# Patient Record
Sex: Female | Born: 2005 | Race: Black or African American | Hispanic: Yes | Marital: Single | Smoking: Never smoker
Health system: Southern US, Community
[De-identification: ages and names within clinical notes are randomized; demographics above are authoritative.]

---
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
## 2023-01-05 ENCOUNTER — Ambulatory Visit: Payer: No Typology Code available for payment source | Admitting: Family

## 2023-01-12 ENCOUNTER — Ambulatory Visit (INDEPENDENT_AMBULATORY_CARE_PROVIDER_SITE_OTHER): Payer: No Typology Code available for payment source | Admitting: Family

## 2023-01-12 ENCOUNTER — Encounter: Payer: Self-pay | Admitting: Family

## 2023-01-12 VITALS — BP 107/71 | HR 90 | Ht 63.78 in | Wt 158.0 lb

## 2023-01-12 DIAGNOSIS — Z3042 Encounter for surveillance of injectable contraceptive: Secondary | ICD-10-CM | POA: Diagnosis not present

## 2023-01-12 DIAGNOSIS — N921 Excessive and frequent menstruation with irregular cycle: Secondary | ICD-10-CM

## 2023-01-12 DIAGNOSIS — Z113 Encounter for screening for infections with a predominantly sexual mode of transmission: Secondary | ICD-10-CM

## 2023-01-12 MED ORDER — MEDROXYPROGESTERONE ACETATE 150 MG/ML IM SUSP: 150.0000 mg | Freq: Once | INTRAMUSCULAR | Status: DC

## 2023-01-12 MED ORDER — MEDROXYPROGESTERONE ACETATE 150 MG/ML IM SUSP
150.0000 mg | Freq: Once | INTRAMUSCULAR | Status: AC
  Administered 2023-01-12: 150 mg via INTRAMUSCULAR

## 2023-01-12 MED ORDER — NORETHINDRONE ACETATE 5 MG PO TABS
5.0000 mg | ORAL_TABLET | Freq: Every day | ORAL | 2 refills | Status: DC
Start: 2023-01-12 — End: 2023-06-21

## 2023-01-12 NOTE — Progress Notes (Signed)
History was provided by the patient.  April Kelley is a 17 y.o. female who is here for breakthrough bleeding with depo.   PCP confirmed? Yes.    Pa, [place] from last visit: Assessment/Plan: 1. Dysmenorrhea 2. Encounter for Depo-Provera contraception -continue with depo -return 2 weeks before depo window starts   - medroxyPROGESTERone (DEPO-PROVERA) injection 150 mg   HPI:   -depo today  -needs refill for Aygestin, uses it when she has her period with depo or any spotting -usually has period or breakthrough bleeding after about one month of depo  -not really heavy at all; but cramping  -sexually active, no discharge changes no lesion, no pain with intercourse    Current Outpatient Medications on File Prior to Visit  Medication Sig Dispense Refill   norethindrone (AYGESTIN) 5 MG tablet Take 1 tablet (5 mg total) by mouth daily. 30 tablet 0   No current facility-administered medications on file prior to visit.    No Known Allergies  Physical Exam:    Vitals:   01/12/23 1342  BP: 107/71  Pulse: 90  Weight: 158 lb (71.7 kg)  Height: 5' 3.78" (1.62 m)    Blood pressure %iles are not available for patients who are 18 years or older. No LMP recorded.  Physical Exam Constitutional:      General: She is not in acute distress.    Appearance: She is well-developed.  HENT:     Head: Normocephalic and atraumatic.  Eyes:     General: No scleral icterus.    Pupils: Pupils are equal, round, and reactive to light.  Neck:     Thyroid: No thyromegaly.  Cardiovascular:     Rate and Rhythm: Normal rate and regular rhythm.     Heart sounds: Normal heart sounds. No murmur heard. Pulmonary:     Effort: Pulmonary effort is normal.     Breath sounds: Normal breath sounds.  Abdominal:     Palpations: Abdomen is soft.  Genitourinary:    Comments: Deferred  Musculoskeletal:        General: Normal range of motion.     Cervical back: Normal  range of motion and neck supple.  Lymphadenopathy:     Cervical: No cervical adenopathy.  Skin:    General: Skin is warm and dry.     Findings: No rash.  Neurological:     Mental Status: She is alert. She is disoriented.     Cranial Nerves: No cranial nerve deficit.  Psychiatric:        Behavior: Behavior normal.        Thought Content: Thought content normal.        Judgment: Judgment normal.      Assessment/Plan:  Breakthrough bleeding with depo, typically seen about one month after each injection is well-managed with Aygestin 5 mg as needed for bleeding. Will repeat gc/c. Return two weeks earlier than Depo window start or if new or worsening symptoms. Advised to track bleeding, consider not taking Aygestin as she may also be experiencing withdrawal bleeding 2/2 stopping norethindrone each time.   1. Breakthrough bleeding on depo provera 2. Encounter for Depo-Provera contraception - medroxyPROGESTERone (DEPO-PROVERA) injection 150 mg  3. Routine screening for STI (sexually transmitted infection) - C. trachomatis/N. gonorrhoeae RNA

## 2023-01-13 LAB — C. TRACHOMATIS/N. GONORRHOEAE RNA
C. trachomatis RNA, TMA: NOT DETECTED
N. gonorrhoeae RNA, TMA: NOT DETECTED

## 2023-03-15 ENCOUNTER — Encounter: Payer: No Typology Code available for payment source | Admitting: Family

## 2023-03-16 ENCOUNTER — Encounter: Payer: Self-pay | Admitting: Family

## 2023-03-16 ENCOUNTER — Other Ambulatory Visit (HOSPITAL_COMMUNITY)
Admission: RE | Admit: 2023-03-16 | Discharge: 2023-03-16 | Disposition: A | Discharge status: Home / Self Care | Payer: No Typology Code available for payment source | Source: Ambulatory Visit | Attending: Family | Admitting: Family

## 2023-03-16 ENCOUNTER — Ambulatory Visit (INDEPENDENT_AMBULATORY_CARE_PROVIDER_SITE_OTHER): Payer: No Typology Code available for payment source | Admitting: Family

## 2023-03-16 VITALS — BP 118/71 | HR 111 | Ht 63.78 in | Wt 162.8 lb

## 2023-03-16 DIAGNOSIS — R82998 Other abnormal findings in urine: Secondary | ICD-10-CM

## 2023-03-16 DIAGNOSIS — N921 Excessive and frequent menstruation with irregular cycle: Secondary | ICD-10-CM

## 2023-03-16 DIAGNOSIS — E611 Iron deficiency: Secondary | ICD-10-CM

## 2023-03-16 DIAGNOSIS — Z3042 Encounter for surveillance of injectable contraceptive: Secondary | ICD-10-CM

## 2023-03-16 DIAGNOSIS — Z1389 Encounter for screening for other disorder: Secondary | ICD-10-CM

## 2023-03-16 DIAGNOSIS — Z113 Encounter for screening for infections with a predominantly sexual mode of transmission: Secondary | ICD-10-CM | POA: Insufficient documentation

## 2023-03-16 DIAGNOSIS — Z3202 Encounter for pregnancy test, result negative: Secondary | ICD-10-CM | POA: Diagnosis not present

## 2023-03-16 LAB — POCT URINALYSIS DIPSTICK
Bilirubin, UA: NEGATIVE
Glucose, UA: NEGATIVE
Ketones, UA: NEGATIVE
Nitrite, UA: NEGATIVE
Protein, UA: NEGATIVE
Spec Grav, UA: 1.025 (ref 1.010–1.025)
Urobilinogen, UA: 1 E.U./dL
pH, UA: 6 (ref 5.0–8.0)

## 2023-03-16 LAB — POCT URINE PREGNANCY: Preg Test, Ur: NEGATIVE

## 2023-03-16 LAB — POCT HEMOGLOBIN: Hemoglobin: 13.6 g/dL (ref 11–14.6)

## 2023-03-16 MED ORDER — MEDROXYPROGESTERONE ACETATE 150 MG/ML IM SUSP
150.0000 mg | Freq: Once | INTRAMUSCULAR | Status: AC
  Administered 2023-03-16: 150 mg via INTRAMUSCULAR

## 2023-03-16 NOTE — Progress Notes (Signed)
History was provided by the patient.  April Kelley is a 17 y.o. female who is here for breakthrough bleeding on depo provera.   PCP confirmed? Yes.    Pa, [facility]  HPI:   -got period 2-3 times between depo; was not heavy  -was mostly spotting and the Aygestin 5 mg worked  -no pain with urination, currently bleeding now  -wants to keep with depo  -some cramping lightly with bleeding    Current Outpatient Medications on File Prior to Visit  Medication Sig Dispense Refill   norethindrone (AYGESTIN) 5 MG tablet Take 1 tablet (5 mg total) by mouth daily. 30 tablet 2   No current facility-administered medications on file prior to visit.    No Known Allergies  Physical Exam:    Vitals:   03/16/23 1343  BP: 118/71  Pulse: (!) 111  Weight: 162 lb 12.8 oz (73.8 kg)  Height: 5' 3.78" (1.62 m)   Wt Readings from Last 3 Encounters:  03/16/23 162 lb 12.8 oz (73.8 kg) (90 %, Z= 1.28)*  01/12/23 158 lb (71.7 kg) (88 %, Z= 1.17)*  10/20/22 157 lb 12.8 oz (71.6 kg) (88 %, Z= 1.18)*   * Growth percentiles are based on CDC (Girls, 2-20 Years) data.     Blood pressure %iles are not available for patients who are 18 years or older. No LMP recorded.  Physical Exam Constitutional:      General: She is not in acute distress.    Appearance: She is well-developed.  HENT:     Head: Normocephalic and atraumatic.  Eyes:     General: No scleral icterus.    Pupils: Pupils are equal, round, and reactive to light.  Neck:     Thyroid: No thyromegaly.  Cardiovascular:     Rate and Rhythm: Normal rate and regular rhythm.     Heart sounds: Normal heart sounds. No murmur heard. Pulmonary:     Effort: Pulmonary effort is normal.     Breath sounds: Normal breath sounds.  Musculoskeletal:        General: Normal range of motion.     Cervical back: Normal range of motion and neck supple.  Lymphadenopathy:     Cervical: No cervical adenopathy.  Skin:    General:  Skin is warm and dry.     Findings: No rash.  Neurological:     Mental Status: She is alert and oriented to person, place, and time.     Cranial Nerves: No cranial nerve deficit.  Psychiatric:        Behavior: Behavior normal.        Thought Content: Thought content normal.        Judgment: Judgment normal.     Lab Results  Component Value Date   HGB 13.6 03/16/2023    Assessment/Plan:  -continue with Depo provera, reviewed Hgb WNL  -advised that we will culture urine due to foul smell and leukocytes -return in 10 weeks for depo 2 weeks earlier than window  -return precautions reviewed  1. Breakthrough bleeding on depo provera 2. Encounter for Depo-Provera contraception - medroxyPROGESTERone (DEPO-PROVERA) injection 150 mg 3. Leukocytes in urine - Urine Culture 4. Iron deficiency - POCT hemoglobin 5. Routine screening for STI (sexually transmitted infection) - Urine cytology ancillary only 6. Pregnancy examination or test, negative result - POCT urine pregnancy 7. Screening for genitourinary condition - POCT urinalysis dipstick

## 2023-03-17 LAB — URINE CYTOLOGY ANCILLARY ONLY
Bacterial Vaginitis-Urine: NEGATIVE
Candida Urine: NEGATIVE
Chlamydia: NEGATIVE
Comment: NEGATIVE
Comment: NEGATIVE
Comment: NORMAL
Neisseria Gonorrhea: NEGATIVE
Trichomonas: NEGATIVE

## 2023-03-17 LAB — URINE CULTURE
MICRO NUMBER:: 15038827
Result:: NO GROWTH
SPECIMEN QUALITY:: ADEQUATE

## 2023-05-25 ENCOUNTER — Ambulatory Visit (INDEPENDENT_AMBULATORY_CARE_PROVIDER_SITE_OTHER): Payer: No Typology Code available for payment source | Admitting: Pediatrics

## 2023-05-25 DIAGNOSIS — Z3042 Encounter for surveillance of injectable contraceptive: Secondary | ICD-10-CM | POA: Diagnosis not present

## 2023-05-25 MED ORDER — MEDROXYPROGESTERONE ACETATE 150 MG/ML IM SUSY: PREFILLED_SYRINGE | Freq: Once | INTRAMUSCULAR | Status: DC

## 2023-05-25 MED ORDER — MEDROXYPROGESTERONE ACETATE 150 MG/ML IM SUSP
150.0000 mg | INTRAMUSCULAR | Status: AC
  Administered 2023-05-25: 150 mg via INTRAMUSCULAR

## 2023-05-26 NOTE — Progress Notes (Signed)
Patient received depo-provera injection by nurse and was not seen by provider

## 2023-06-18 ENCOUNTER — Other Ambulatory Visit: Payer: Self-pay | Admitting: Family

## 2023-07-22 ENCOUNTER — Ambulatory Visit (HOSPITAL_COMMUNITY)
Admission: EM | Admit: 2023-07-22 | Discharge: 2023-07-22 | Disposition: A | Discharge status: Home / Self Care | Payer: No Typology Code available for payment source | Attending: Emergency Medicine | Admitting: Emergency Medicine

## 2023-07-22 ENCOUNTER — Encounter (HOSPITAL_COMMUNITY): Payer: Self-pay

## 2023-07-22 DIAGNOSIS — B349 Viral infection, unspecified: Secondary | ICD-10-CM

## 2023-07-22 DIAGNOSIS — Z1152 Encounter for screening for COVID-19: Secondary | ICD-10-CM | POA: Diagnosis not present

## 2023-07-22 DIAGNOSIS — R509 Fever, unspecified: Secondary | ICD-10-CM | POA: Diagnosis present

## 2023-07-22 DIAGNOSIS — M545 Low back pain, unspecified: Secondary | ICD-10-CM | POA: Insufficient documentation

## 2023-07-22 LAB — POCT URINALYSIS DIP (MANUAL ENTRY)
Bilirubin, UA: NEGATIVE
Glucose, UA: NEGATIVE mg/dL
Ketones, POC UA: NEGATIVE mg/dL
Nitrite, UA: NEGATIVE
Protein Ur, POC: NEGATIVE mg/dL
Spec Grav, UA: >=1.03 — AB (ref 1.010–1.025)
Urobilinogen, UA: 0.2 U/dL
pH, UA: 6 (ref 5.0–8.0)

## 2023-07-22 LAB — POCT INFLUENZA A/B
Influenza A, POC: NEGATIVE
Influenza B, POC: NEGATIVE

## 2023-07-22 LAB — POCT URINE PREGNANCY: Preg Test, Ur: NEGATIVE

## 2023-07-22 MED ORDER — DICYCLOMINE HCL 20 MG PO TABS: 20.0000 mg | ORAL_TABLET | Freq: Three times a day (TID) | ORAL | 0 refills | Status: DC

## 2023-07-22 NOTE — ED Provider Notes (Signed)
[facility]    CSN: [id number] Arrival date & time: 07/22/23  1246      History   Chief Complaint Chief Complaint  Patient presents with   Abdominal Pain    HPI April Kelley is a 17 y.o. female.   Patient presents with concerns of feeling unwell for the past couple days. She reports fatigue, chills, sweats, intermittent headache, body aches, some nasal congestion, occasional cough, abdominal cramping, nausea with one episode of vomiting, decreased appetite, and some diarrhea. She states the abdominal cramping is intermittent and usually in her lower abdomen. She states she has had a low grade fever around 99-100F. She has been taking Theraflu and ibuprofen with some mild improvement. She inquires about making sure she does have COVID or the flu. She denies sore throat, difficulty breathing, or any urinary symptoms such as dysuria.  The history is provided by the patient.  Abdominal Pain Associated symptoms: chills, cough, diarrhea, fatigue, fever, nausea and vomiting   Associated symptoms: no chest pain, no dysuria, no shortness of breath and no sore throat     History reviewed. No pertinent past medical history.  There are no problems to display for this patient.   History reviewed. No pertinent surgical history.  OB History   No obstetric history on file.      Home Medications    Prior to Admission medications   Medication Sig Start Date End Date Taking? Authorizing Provider  dicyclomine (BENTYL) 20 MG tablet Take 1 tablet (20 mg total) by mouth 4 (four) times daily -  before meals and at bedtime for 3 days. 07/22/23 07/25/23 Yes Xayvion Shirah L, PA  montelukast (SINGULAIR) 4 MG chewable tablet Chew 4 mg by mouth at bedtime. 06/15/22  Yes [provider]  norethindrone (AYGESTIN) 5 MG tablet TAKE 1 TABLET(5 MG) BY MOUTH DAILY 06/21/23   Georges Mouse, NP    Family History History reviewed. No pertinent family history.  Social History Social  History   Tobacco Use   Smoking status: Never    Passive exposure: Yes   Smokeless tobacco: Never  Vaping Use   Vaping status: Every Day  Substance Use Topics   Alcohol use: Yes    Comment: socially   Drug use: Yes    Types: Marijuana     Allergies   Patient has no known allergies.   Review of Systems Review of Systems  Constitutional:  Positive for appetite change, chills, diaphoresis, fatigue and fever.  HENT:  Positive for congestion. Negative for ear pain and sore throat.   Respiratory:  Positive for cough. Negative for shortness of breath and wheezing.   Cardiovascular:  Negative for chest pain.  Gastrointestinal:  Positive for abdominal pain, diarrhea, nausea and vomiting. Negative for blood in stool.  Genitourinary:  Negative for dysuria, frequency and urgency.  Musculoskeletal:  Positive for myalgias. Negative for back pain.  Skin:  Negative for rash.  Neurological:  Positive for headaches. Negative for dizziness.     Physical Exam Triage Vital Signs ED Triage Vitals  Encounter Vitals Group     BP 07/22/23 1415 115/78     Systolic BP Percentile --      Diastolic BP Percentile --      Pulse Rate 07/22/23 1415 97     Resp 07/22/23 1415 16     Temp 07/22/23 1415 99 F (37.2 C)     Temp Source 07/22/23 1415 Oral     SpO2 07/22/23 1415 95 %  Weight 07/22/23 1415 180 lb (81.6 kg)     Height 07/22/23 1415 5' 3.25" (1.607 m)     Head Circumference --      Peak Flow --      Pain Score 07/22/23 1414 8     Pain Loc --      Pain Education --      Exclude from Growth Chart --    No data found.  Updated Vital Signs BP 115/78 (BP Location: Left Arm)   Pulse 97   Temp 99 F (37.2 C) (Oral)   Resp 16   Ht 5' 3.25" (1.607 m)   Wt 180 lb (81.6 kg)   LMP  (LMP Unknown)   SpO2 95%   BMI 31.63 kg/m   Visual Acuity Right Eye Distance:   Left Eye Distance:   Bilateral Distance:    Right Eye Near:   Left Eye Near:    Bilateral Near:     Physical  Exam Vitals and nursing note reviewed.  HENT:     Head: Normocephalic.     Nose: Nose normal.     Mouth/Throat:     Mouth: Mucous membranes are moist.     Pharynx: Oropharynx is clear.  Eyes:     Conjunctiva/sclera: Conjunctivae normal.     Pupils: Pupils are equal, round, and reactive to light.  Cardiovascular:     Rate and Rhythm: Normal rate and regular rhythm.     Heart sounds: Normal heart sounds.  Pulmonary:     Effort: Pulmonary effort is normal.     Breath sounds: Normal breath sounds. No wheezing, rhonchi or rales.  Abdominal:     General: Bowel sounds are normal.     Palpations: Abdomen is soft.     Tenderness: There is no abdominal tenderness. There is no right CVA tenderness, left CVA tenderness, guarding or rebound.  Musculoskeletal:     Cervical back: Normal range of motion.  Lymphadenopathy:     Cervical: No cervical adenopathy.  Skin:    Findings: No rash.  Neurological:     Mental Status: She is alert.  Psychiatric:        Mood and Affect: Mood normal.      UC Treatments / Results  Labs (all labs ordered are listed, but only abnormal results are displayed) Labs Reviewed  POCT URINALYSIS DIP (MANUAL ENTRY) - Abnormal; Notable for the following components:      Result Value   Clarity, UA cloudy (*)    Spec Grav, UA >=1.030 (*)    Blood, UA large (*)    Leukocytes, UA Trace (*)    All other components within normal limits  SARS CORONAVIRUS 2 (TAT 6-24 HRS)  POCT URINE PREGNANCY  POCT INFLUENZA A/B    EKG   Radiology No results found.  Procedures Procedures (including critical care time)  Medications Ordered in UC Medications - No data to display  Initial Impression / Assessment and Plan / UC Course  I have reviewed the triage vital signs and the nursing notes.  Pertinent labs & imaging results that were available during my care of the patient were reviewed by me and considered in my medical decision making (see chart for details).      Flu negative, COVID pending. Trace leuks on urine but no UTI sx, low suspicion of UTI. S/s consistent with viral infection. No abdominal tenderness or high fever to indicate more severe abdominal infectious process at this time. Sx tx and reassurance. Discussed return  and ER precautions.   E/M: 1 acute uncomplicated illness, 4 data (UA, preg, flu, COVID), moderate risk due to prescription management   Final Clinical Impressions(s) / UC Diagnoses   Final diagnoses:  Viral infection     Discharge Instructions      Flu test negative. COVID test sent out - we will contact you with results.  Take Bentyl as prescribed as needed to help with abdominal cramping. Rest and keep hydrated. Can take OTC medicine as well for symptoms.  Follow-up with PCP if no improvement in a week. Go to the ER if develop severe abdominal pain, vomiting and unable to keep down fluids for more than 24 hours, or difficulty breathing.     ED Prescriptions     Medication Sig Dispense Auth. Provider   dicyclomine (BENTYL) 20 MG tablet Take 1 tablet (20 mg total) by mouth 4 (four) times daily -  before meals and at bedtime for 3 days. 12 tablet Vallery Sa, Rameen Gohlke L, PA      PDMP not reviewed this encounter.   Estanislado Pandy, [place] 07/22/23 1525

## 2023-07-22 NOTE — ED Triage Notes (Signed)
Patient here today with c/o lower abd cramping, nausea, loss of appetite, fever, chills, sweats, and body aches X 2 days. Temp at home was over 100. She has been taking Theraflu and IBU with some relief.

## 2023-07-22 NOTE — Discharge Instructions (Signed)
Flu test negative. COVID test sent out - we will contact you with results.  Take Bentyl as prescribed as needed to help with abdominal cramping. Rest and keep hydrated. Can take OTC medicine as well for symptoms.  Follow-up with PCP if no improvement in a week. Go to the ER if develop severe abdominal pain, vomiting and unable to keep down fluids for more than 24 hours, or difficulty breathing.

## 2023-07-23 LAB — SARS CORONAVIRUS 2 (TAT 6-24 HRS): SARS Coronavirus 2: NEGATIVE

## 2023-08-03 ENCOUNTER — Encounter: Payer: Self-pay | Admitting: Family

## 2023-08-03 ENCOUNTER — Ambulatory Visit: Payer: No Typology Code available for payment source | Admitting: Family

## 2023-08-03 VITALS — BP 109/68 | HR 101 | Ht 63.48 in | Wt 169.6 lb

## 2023-08-03 DIAGNOSIS — Z3042 Encounter for surveillance of injectable contraceptive: Secondary | ICD-10-CM

## 2023-08-03 MED ORDER — MEDROXYPROGESTERONE ACETATE 150 MG/ML IM SUSP
150.0000 mg | Freq: Once | INTRAMUSCULAR | Status: AC
  Administered 2023-08-03: 150 mg via INTRAMUSCULAR

## 2023-08-03 NOTE — Progress Notes (Signed)
Pt presents for depo injection. Pt within depo window, no urine hcg needed.  Injection given, tolerated well. F/u depo injection visit scheduled.    First depo injection: 01/16/2020 Due for bone density: normal bone density DXA on 03/23/2022, next due on 03/23/2024  Patient last 3 weights:  Wt Readings from Last 3 Encounters:  08/03/23 169 lb 9.6 oz (76.9 kg) (92%, Z= 1.41)*  07/22/23 180 lb (81.6 kg) (95%, Z= 1.62)*  03/16/23 162 lb 12.8 oz (73.8 kg) (90%, Z= 1.28)*   * Growth percentiles are based on CDC (Girls, 2-20 Years) data.     Last Blood Pressure:   BP Readings from Last 3 Encounters:  08/03/23 109/68  07/22/23 115/78  03/16/23 118/71

## 2023-10-21 ENCOUNTER — Ambulatory Visit (INDEPENDENT_AMBULATORY_CARE_PROVIDER_SITE_OTHER): Payer: No Typology Code available for payment source | Admitting: Family

## 2023-10-21 ENCOUNTER — Encounter: Payer: Self-pay | Admitting: Family

## 2023-10-21 VITALS — BP 107/64 | HR 89 | Ht 64.0 in | Wt 175.4 lb

## 2023-10-21 DIAGNOSIS — Z3042 Encounter for surveillance of injectable contraceptive: Secondary | ICD-10-CM

## 2023-10-21 MED ORDER — MEDROXYPROGESTERONE ACETATE 150 MG/ML IM SUSP
150.0000 mg | Freq: Once | INTRAMUSCULAR | Status: AC
  Administered 2023-10-21: 150 mg via INTRAMUSCULAR

## 2023-10-21 NOTE — Progress Notes (Signed)
 Pt presents for depo injection. Pt within depo window, no urine hcg needed.  Injection given, tolerated well. F/u depo injection visit scheduled.    First depo injection: 01/16/2020 Last DXA: 03/19/2022 (normal)  Due for bone density: 03/19/2024  Patient last 3 weights:  Wt Readings from Last 3 Encounters:  10/21/23 175 lb 6.4 oz (79.6 kg) (94%, Z= 1.52)*  08/03/23 169 lb 9.6 oz (76.9 kg) (92%, Z= 1.41)*  07/22/23 180 lb (81.6 kg) (95%, Z= 1.62)*   * Growth percentiles are based on CDC (Girls, 2-20 Years) data.     Last Blood Pressure:   BP Readings from Last 3 Encounters:  10/21/23 107/64  08/03/23 109/68  07/22/23 115/78

## 2023-10-29 ENCOUNTER — Other Ambulatory Visit: Payer: Self-pay | Admitting: Family

## 2023-11-25 ENCOUNTER — Other Ambulatory Visit: Payer: Self-pay | Admitting: Family

## 2023-11-25 ENCOUNTER — Encounter: Payer: Self-pay | Admitting: Family

## 2023-11-25 MED ORDER — NORETHINDRONE ACETATE 5 MG PO TABS: ORAL_TABLET | ORAL | 0 refills | Status: DC

## 2023-12-19 ENCOUNTER — Other Ambulatory Visit: Payer: Self-pay | Admitting: Family

## 2023-12-30 ENCOUNTER — Other Ambulatory Visit (HOSPITAL_COMMUNITY)
Admission: RE | Admit: 2023-12-30 | Discharge: 2023-12-30 | Disposition: A | Discharge status: Home / Self Care | Source: Ambulatory Visit | Attending: Family | Admitting: Family

## 2023-12-30 ENCOUNTER — Ambulatory Visit (INDEPENDENT_AMBULATORY_CARE_PROVIDER_SITE_OTHER): Payer: No Typology Code available for payment source | Admitting: Family

## 2023-12-30 ENCOUNTER — Encounter: Payer: Self-pay | Admitting: Family

## 2023-12-30 VITALS — BP 110/70 | HR 75 | Ht 63.48 in | Wt 173.4 lb

## 2023-12-30 DIAGNOSIS — N921 Excessive and frequent menstruation with irregular cycle: Secondary | ICD-10-CM | POA: Diagnosis not present

## 2023-12-30 DIAGNOSIS — Z113 Encounter for screening for infections with a predominantly sexual mode of transmission: Secondary | ICD-10-CM

## 2023-12-30 DIAGNOSIS — Z3042 Encounter for surveillance of injectable contraceptive: Secondary | ICD-10-CM | POA: Diagnosis not present

## 2023-12-30 MED ORDER — MEDROXYPROGESTERONE ACETATE 150 MG/ML IM SUSP
150.0000 mg | Freq: Once | INTRAMUSCULAR | Status: AC
  Administered 2023-12-30: 150 mg via INTRAMUSCULAR

## 2023-12-30 NOTE — Progress Notes (Signed)
 History was provided by the patient.  April Kelley is a 18 y.o. female who is here for Depo.   PCP confirmed? Yes.    Pa, [facility]  HPI:   Having some breakthrough bleeding with depo  Cramping when bleeding only  No significant change in pattern or pain  Considering coming off depo but wants to be respectful of mom; denies pregnancy intention  Wants injection today, plans to schedule follow-up in 10 weeks and will decide between now and then if she will continue with method.   Current Outpatient Medications on File Prior to Visit  Medication Sig Dispense Refill   norethindrone (AYGESTIN) 5 MG tablet TAKE 1 TABLET BY MOUTH AS NEEDED FOR BREAKTHROUGH BLEEDING 30 tablet 0   dicyclomine (BENTYL) 20 MG tablet Take 1 tablet (20 mg total) by mouth 4 (four) times daily -  before meals and at bedtime for 3 days. 12 tablet 0   montelukast (SINGULAIR) 4 MG chewable tablet Chew 4 mg by mouth at bedtime. (Patient not taking: Reported on 12/30/2023)     Current Facility-Administered Medications on File Prior to Visit  Medication Dose Route Frequency Provider Last Rate Last Admin   medroxyPROGESTERone (DEPO-PROVERA) injection 150 mg  150 mg Intramuscular Q90 days Kathi Simpers, MD   150 mg at 05/25/23 1522    No Known Allergies  Physical Exam:    Vitals:   12/30/23 1013  BP: 110/70  Pulse: 75  Weight: 173 lb 6.4 oz (78.7 kg)  Height: 5' 3.48" (1.612 m)   Wt Readings from Last 3 Encounters:  12/30/23 173 lb 6.4 oz (78.7 kg) (93%, Z= 1.46)*  10/21/23 175 lb 6.4 oz (79.6 kg) (94%, Z= 1.52)*  08/03/23 169 lb 9.6 oz (76.9 kg) (92%, Z= 1.41)*   * Growth percentiles are based on CDC (Girls, 2-20 Years) data.     Blood pressure %iles are not available for patients who are 18 years or older. No LMP recorded. Patient has had an injection.  Physical Exam Constitutional:      General: She is not in acute distress.    Appearance: She is well-developed.  HENT:      Head: Normocephalic and atraumatic.  Eyes:     General: No scleral icterus.    Pupils: Pupils are equal, round, and reactive to light.  Neck:     Thyroid: No thyromegaly.  Cardiovascular:     Rate and Rhythm: Normal rate and regular rhythm.     Heart sounds: Normal heart sounds. No murmur heard. Pulmonary:     Effort: Pulmonary effort is normal.     Breath sounds: Normal breath sounds.  Musculoskeletal:        General: Normal range of motion.     Cervical back: Normal range of motion and neck supple.  Lymphadenopathy:     Cervical: No cervical adenopathy.  Skin:    General: Skin is warm and dry.     Capillary Refill: Capillary refill takes less than 2 seconds.     Findings: No rash.  Neurological:     Mental Status: She is alert and oriented to person, place, and time.     Cranial Nerves: No cranial nerve deficit.     Motor: No tremor.  Psychiatric:        Attention and Perception: Attention normal.        Mood and Affect: Mood normal.        Speech: Speech normal.  Behavior: Behavior normal.        Thought Content: Thought content normal.        Judgment: Judgment normal.      Assessment/Plan: 1. Breakthrough bleeding on depo provera (Primary) 2. Encounter for Depo-Provera contraception -continue with method  -return precautions reviewed -discussed EC as option as needed if she decides to come off depo - medroxyPROGESTERone (DEPO-PROVERA) injection 150 mg  3. Routine screening for STI (sexually transmitted infection) -screen for gc/c  - Urine cytology ancillary only

## 2024-01-04 LAB — URINE CYTOLOGY ANCILLARY ONLY
Bacterial Vaginitis-Urine: NEGATIVE
Candida Urine: NEGATIVE
Chlamydia: NEGATIVE
Comment: NEGATIVE
Comment: NEGATIVE
Comment: NORMAL
Neisseria Gonorrhea: NEGATIVE
Trichomonas: NEGATIVE

## 2024-01-12 ENCOUNTER — Other Ambulatory Visit: Payer: Self-pay | Admitting: Family

## 2024-01-13 MED ORDER — NORETHINDRONE ACETATE 5 MG PO TABS: ORAL_TABLET | ORAL | 1 refills | Status: DC

## 2024-02-05 ENCOUNTER — Encounter: Payer: Self-pay | Admitting: Family

## 2024-02-07 ENCOUNTER — Other Ambulatory Visit: Payer: Self-pay | Admitting: Family

## 2024-02-07 MED ORDER — NORETHINDRONE ACETATE 5 MG PO TABS
ORAL_TABLET | ORAL | 1 refills | Status: DC
Start: 2024-02-07 — End: 2024-07-06

## 2024-03-09 ENCOUNTER — Ambulatory Visit: Admitting: Family

## 2024-03-09 ENCOUNTER — Encounter: Payer: Self-pay | Admitting: Family

## 2024-03-09 VITALS — BP 104/70 | HR 89 | Wt 169.4 lb

## 2024-03-09 DIAGNOSIS — Z3042 Encounter for surveillance of injectable contraceptive: Secondary | ICD-10-CM | POA: Diagnosis not present

## 2024-03-09 MED ORDER — MEDROXYPROGESTERONE ACETATE 150 MG/ML IM SUSP
150.0000 mg | Freq: Once | INTRAMUSCULAR | Status: AC
  Administered 2024-03-09: 150 mg via INTRAMUSCULAR

## 2024-03-09 NOTE — Progress Notes (Signed)
 Pt presents for depo injection.  Pt within depo window, no urine hcg needed.  Injection given, tolerated well.  F/u depo injection visit scheduled.    First depo injection:  01/16/2020 Due for bone density: 03/19/24 - DISCUSS DXA DUE AT NEXT VISIT  Patient last 3 weights:  Wt Readings from Last 3 Encounters:  03/09/24 169 lb 6.4 oz (76.8 kg) (91%, Z= 1.37)*  12/30/23 173 lb 6.4 oz (78.7 kg) (93%, Z= 1.46)*  10/21/23 175 lb 6.4 oz (79.6 kg) (94%, Z= 1.52)*   * Growth percentiles are based on CDC (Girls, 2-20 Years) data.    Last Blood Pressure:  BP Readings from Last 3 Encounters:  03/09/24 104/70  12/30/23 110/70  10/21/23 107/64

## 2024-04-17 ENCOUNTER — Other Ambulatory Visit: Payer: Self-pay | Admitting: Family

## 2024-05-13 ENCOUNTER — Encounter: Payer: Self-pay | Admitting: Family

## 2024-05-15 ENCOUNTER — Ambulatory Visit (INDEPENDENT_AMBULATORY_CARE_PROVIDER_SITE_OTHER): Admitting: Family

## 2024-05-15 ENCOUNTER — Encounter: Payer: Self-pay | Admitting: Family

## 2024-05-15 VITALS — BP 125/78 | HR 96 | Ht 63.39 in | Wt 180.0 lb

## 2024-05-15 DIAGNOSIS — Z3042 Encounter for surveillance of injectable contraceptive: Secondary | ICD-10-CM

## 2024-05-15 MED ORDER — MEDROXYPROGESTERONE ACETATE 150 MG/ML IM SUSP
150.0000 mg | Freq: Once | INTRAMUSCULAR | Status: AC
  Administered 2024-05-15: 150 mg via INTRAMUSCULAR

## 2024-05-15 NOTE — Progress Notes (Signed)
 Pt presents for depo injection.  Pt within depo window, no urine hcg needed.  Injection given, tolerated well.  F/u depo injection visit scheduled.    First depo injection:  Due for bone density: now; ordered today   Patient last 3 weights:  Wt Readings from Last 3 Encounters:  05/15/24 180 lb (81.6 kg)  03/09/24 169 lb 6.4 oz (76.8 kg) (91%, Z= 1.37)*  12/30/23 173 lb 6.4 oz (78.7 kg) (93%, Z= 1.46)*   * Growth percentiles are based on CDC (Girls, 2-20 Years) data.     Last Blood Pressure:  BP Readings from Last 3 Encounters:  05/15/24 125/78  03/09/24 104/70  12/30/23 110/70

## 2024-05-18 ENCOUNTER — Encounter: Admitting: Family

## 2024-07-05 ENCOUNTER — Encounter: Payer: Self-pay | Admitting: Family

## 2024-07-06 ENCOUNTER — Other Ambulatory Visit: Payer: Self-pay | Admitting: Family

## 2024-07-06 MED ORDER — NORETHINDRONE ACETATE 5 MG PO TABS: ORAL_TABLET | ORAL | 1 refills | Status: DC

## 2024-07-24 ENCOUNTER — Other Ambulatory Visit (HOSPITAL_COMMUNITY)
Admission: RE | Admit: 2024-07-24 | Discharge: 2024-07-24 | Disposition: A | Discharge status: Home / Self Care | Source: Ambulatory Visit | Attending: Family | Admitting: Family

## 2024-07-24 ENCOUNTER — Encounter: Payer: Self-pay | Admitting: Family

## 2024-07-24 ENCOUNTER — Ambulatory Visit (INDEPENDENT_AMBULATORY_CARE_PROVIDER_SITE_OTHER): Admitting: Family

## 2024-07-24 VITALS — BP 123/72 | HR 89 | Ht 63.48 in | Wt 169.4 lb

## 2024-07-24 DIAGNOSIS — N921 Excessive and frequent menstruation with irregular cycle: Secondary | ICD-10-CM

## 2024-07-24 DIAGNOSIS — Z3042 Encounter for surveillance of injectable contraceptive: Secondary | ICD-10-CM | POA: Diagnosis not present

## 2024-07-24 DIAGNOSIS — Z113 Encounter for screening for infections with a predominantly sexual mode of transmission: Secondary | ICD-10-CM

## 2024-07-24 MED ORDER — MEDROXYPROGESTERONE ACETATE 150 MG/ML IM SUSP
150.0000 mg | Freq: Once | INTRAMUSCULAR | Status: AC
  Administered 2024-07-24: 150 mg via INTRAMUSCULAR

## 2024-07-24 NOTE — Progress Notes (Unsigned)
 History was provided by the {relatives:[id number]}.  April Kelley is a 18 y.o. female who is here for ***.   PCP confirmed? {yes no:[id number]}  Pa, [facility]  Plan from last visit: ***  Pertinent Labs: ***  Chart/Growth Chart Review: ***  HPI:  -knows she needs the bone density  -will schedule it, has to figure out how to fit in work schedule ([employer])  -has some breakthrough bleeding, will usually stop at some point after Depo, rarely has to use the pills; light cramping rare -no pain with intercourse, no dysuria    Current Outpatient Medications on File Prior to Visit  Medication Sig Dispense Refill   norethindrone  (AYGESTIN ) 5 MG tablet TAKE 1 TABLET BY MOUTH AS NEEDED FOR BREAKTHROUGH BLEEDING 30 tablet 1   dicyclomine  (BENTYL ) 20 MG tablet Take 1 tablet (20 mg total) by mouth 4 (four) times daily -  before meals and at bedtime for 3 days. 12 tablet 0   montelukast (SINGULAIR) 4 MG chewable tablet Chew 4 mg by mouth at bedtime. (Patient not taking: Reported on 10/21/2023)     Current Facility-Administered Medications on File Prior to Visit  Medication Dose Route Frequency Provider Last Rate Last Admin   medroxyPROGESTERone  (DEPO-PROVERA ) injection 150 mg  150 mg Intramuscular Q90 days Kreg Standing, MD   150 mg at 05/25/23 1522    No Known Allergies  Physical Exam:    Vitals:   07/24/24 1506  BP: 123/72  Pulse: 89  Weight: 169 lb 6.4 oz (76.8 kg)  Height: 5' 3.48 (1.612 m)    Growth %ile SmartLinks can only be used for patients less than 36 years old. No LMP recorded. Patient has had an injection.  Physical Exam   Assessment/Plan: ***

## 2024-07-26 ENCOUNTER — Encounter: Payer: Self-pay | Admitting: Family

## 2024-07-26 LAB — URINE CYTOLOGY ANCILLARY ONLY
Chlamydia: NEGATIVE
Comment: NEGATIVE
Comment: NEGATIVE
Comment: NORMAL
Neisseria Gonorrhea: NEGATIVE
Trichomonas: NEGATIVE

## 2024-08-12 ENCOUNTER — Emergency Department (HOSPITAL_COMMUNITY)
Admission: EM | Admit: 2024-08-12 | Discharge: 2024-08-12 | Disposition: A | Discharge status: Home / Self Care | Attending: Emergency Medicine | Admitting: Emergency Medicine

## 2024-08-12 ENCOUNTER — Other Ambulatory Visit: Payer: Self-pay

## 2024-08-12 DIAGNOSIS — H5789 Other specified disorders of eye and adnexa: Secondary | ICD-10-CM | POA: Insufficient documentation

## 2024-08-12 MED ORDER — FLUORESCEIN SODIUM 1 MG OP STRP
1.0000 | ORAL_STRIP | Freq: Once | OPHTHALMIC | Status: AC
  Administered 2024-08-12: 1 via OPHTHALMIC
  Filled 2024-08-12: qty 1

## 2024-08-12 MED ORDER — TETRACAINE HCL 0.5 % OP SOLN
2.0000 [drp] | Freq: Once | OPHTHALMIC | Status: AC
  Administered 2024-08-12: 2 [drp] via OPHTHALMIC
  Filled 2024-08-12: qty 4

## 2024-08-12 MED ORDER — SYSTANE PRESERVATIVE FREE 0.4-0.3 % OP SOLN: 1.0000 [drp] | OPHTHALMIC | 0 refills | Status: AC | PRN

## 2024-08-12 NOTE — ED Provider Notes (Signed)
 [place] EMERGENCY DEPARTMENT AT [facility] Provider Note   CSN: [id number] Arrival date & time: 08/12/24  0159     Patient presents with: Eye Pain (L)   April Kelley is a 18 y.o. female.   18 yo female with complaint of left eye discomfort. Recently had lashes and make up done. Unsure if related to lashes or makeup. Removed lashes tonight, discomfort persists. Does wear glasses and contacts, does not have them in. Denies drainage, visual changes.  +THC use just PTA.       Prior to Admission medications   Medication Sig Start Date End Date Taking? Authorizing Provider  Polyethyl Glyc-Propyl Glyc PF (SYSTANE PRESERVATIVE FREE) 0.4-0.3 % SOLN Apply 1 drop to eye every 4 (four) hours as needed. 08/12/24  Yes Beverley Leita LABOR, PA-C  montelukast (SINGULAIR) 4 MG chewable tablet Chew 4 mg by mouth at bedtime. Patient not taking: Reported on 07/26/2024 06/15/22   [provider]  norethindrone  (AYGESTIN ) 5 MG tablet TAKE 1 TABLET BY MOUTH AS NEEDED FOR BREAKTHROUGH BLEEDING Patient taking differently: Take 5 mg by mouth daily. TAKE 1 TABLET BY MOUTH AS NEEDED FOR BREAKTHROUGH BLEEDING 07/06/24   Joshua Bari HERO, NP    Allergies: Patient has no known allergies.    Review of Systems Negative except as per HPI Updated Vital Signs BP 130/69 (BP Location: Right Arm)   Pulse 79   Temp 97.6 F (36.4 C) (Oral)   Resp 15   SpO2 99%   Physical Exam Vitals and nursing note reviewed.  Constitutional:      General: She is not in acute distress.    Appearance: She is well-developed. She is not diaphoretic.  HENT:     Head: Normocephalic and atraumatic.  Eyes:     General: Lids are normal. Lids are everted, no foreign bodies appreciated.        Right eye: No discharge or hordeolum.        Left eye: No discharge or hordeolum.     Extraocular Movements: Extraocular movements intact.     Conjunctiva/sclera:     Right eye: Right conjunctiva is injected. No chemosis  or exudate.    Left eye: Left conjunctiva is injected. No chemosis or exudate.    Pupils: Pupils are equal, round, and reactive to light.     Left eye: No corneal abrasion or fluorescein uptake. Seidel exam negative.    Slit lamp exam:    Left eye: No corneal ulcer, foreign body or photophobia.  Pulmonary:     Effort: Pulmonary effort is normal.  Neurological:     Mental Status: She is alert and oriented to person, place, and time.  Psychiatric:        Behavior: Behavior normal.     (all labs ordered are listed, but only abnormal results are displayed) Labs Reviewed - No data to display  EKG: None  Radiology: No results found.   Procedures   Medications Ordered in the ED  fluorescein ophthalmic strip 1 strip (1 strip Left Eye Given 08/12/24 0306)  tetracaine (PONTOCAINE) 0.5 % ophthalmic solution 2 drop (2 drops Left Eye Given 08/12/24 0307)                                    Medical Decision Making Risk OTC drugs. Prescription drug management.   18 year old female with complaint of irritation in the left eye.  Onset after having  her lashes and make-up done.  Lashes were removed, discomfort persists.  No visual disturbance.  Does wear glasses and contacts, does not have them with her today.  No injury to the eye otherwise.  Exam is unremarkable other than bilateral conjunctival injection, possibly related to recent THC use.  Recommend preservative-free drops.  Follow-up with ophthalmology if discomfort persists.     Final diagnoses:  Eye irritation    ED Discharge Orders          Ordered    Polyethyl Glyc-Propyl Glyc PF (SYSTANE PRESERVATIVE FREE) 0.4-0.3 % SOLN  Every 4 hours PRN        08/12/24 0304               Beverley Leita LABOR, PA-C 08/12/24 KIN Jerral Meth, MD 08/12/24 2328

## 2024-08-12 NOTE — Discharge Instructions (Signed)
 Apply drops to eyes as needed as prescribed. Follow up with ophthalmology if symptoms persist, call Monday to schedule an appointment.

## 2024-08-12 NOTE — ED Triage Notes (Signed)
 Pt c/o left eye pain and swelling feeling like their is something stuck in her eye.

## 2024-09-12 ENCOUNTER — Encounter: Payer: Self-pay | Admitting: Family

## 2024-10-10 ENCOUNTER — Encounter: Payer: Self-pay | Admitting: Family

## 2024-10-10 ENCOUNTER — Ambulatory Visit (INDEPENDENT_AMBULATORY_CARE_PROVIDER_SITE_OTHER): Admitting: Family

## 2024-10-10 VITALS — BP 100/67 | HR 83 | Ht 64.0 in | Wt 173.8 lb

## 2024-10-10 DIAGNOSIS — Z3042 Encounter for surveillance of injectable contraceptive: Secondary | ICD-10-CM | POA: Diagnosis not present

## 2024-10-10 MED ORDER — MEDROXYPROGESTERONE ACETATE 150 MG/ML IM SUSP
150.0000 mg | Freq: Once | INTRAMUSCULAR | Status: AC
  Administered 2024-10-10: 150 mg via INTRAMUSCULAR

## 2024-10-10 NOTE — Progress Notes (Signed)
 Pt presents for depo injection. Pt within depo window, no urine hcg needed.  Injection given, tolerated well. F/u depo injection visit scheduled.   Due for bone density: now  Patient last 3 weights:  Wt Readings from Last 3 Encounters:  10/10/24 173 lb 12.8 oz (78.8 kg)  07/24/24 169 lb 6.4 oz (76.8 kg)  05/15/24 180 lb (81.6 kg)     Last Blood Pressure:     BP Readings from Last 3 Encounters:  10/10/24 100/67  08/12/24 128/76  07/24/24 123/72

## 2024-11-06 ENCOUNTER — Other Ambulatory Visit: Payer: Self-pay | Admitting: Family

## 2024-11-15 ENCOUNTER — Ambulatory Visit

## 2024-11-15 ENCOUNTER — Ambulatory Visit: Payer: Self-pay | Admitting: Family

## 2024-11-15 DIAGNOSIS — Z793 Long term (current) use of hormonal contraceptives: Secondary | ICD-10-CM | POA: Diagnosis not present

## 2024-12-25 ENCOUNTER — Encounter: Admitting: Family
